# Patient Record
Sex: Female | Born: 1955 | Hispanic: Yes | Marital: Single
Health system: Southern US, Community
[De-identification: ages and names within clinical notes are randomized; demographics above are authoritative.]

## PROBLEM LIST (undated history)

## (undated) DIAGNOSIS — E7800 Pure hypercholesterolemia, unspecified: Secondary | ICD-10-CM

---

## 2019-11-26 ENCOUNTER — Encounter (HOSPITAL_COMMUNITY): Payer: Self-pay

## 2019-11-26 ENCOUNTER — Other Ambulatory Visit: Payer: Self-pay

## 2019-11-26 ENCOUNTER — Emergency Department (HOSPITAL_COMMUNITY)
Admission: EM | Admit: 2019-11-26 | Discharge: 2019-11-26 | Disposition: A | Discharge status: Home / Self Care | Payer: Medicaid Other | Attending: Emergency Medicine | Admitting: Emergency Medicine

## 2019-11-26 ENCOUNTER — Emergency Department (HOSPITAL_COMMUNITY): Payer: Medicaid Other

## 2019-11-26 DIAGNOSIS — Z79899 Other long term (current) drug therapy: Secondary | ICD-10-CM | POA: Diagnosis not present

## 2019-11-26 DIAGNOSIS — R1010 Upper abdominal pain, unspecified: Secondary | ICD-10-CM | POA: Diagnosis present

## 2019-11-26 DIAGNOSIS — K529 Noninfective gastroenteritis and colitis, unspecified: Secondary | ICD-10-CM | POA: Diagnosis not present

## 2019-11-26 HISTORY — DX: Pure hypercholesterolemia, unspecified: E78.00

## 2019-11-26 LAB — URINALYSIS, ROUTINE W REFLEX MICROSCOPIC
Bilirubin Urine: NEGATIVE
Glucose, UA: NEGATIVE mg/dL
Hgb urine dipstick: NEGATIVE
Ketones, ur: 20 mg/dL — AB
Leukocytes,Ua: NEGATIVE
Nitrite: NEGATIVE
Protein, ur: 30 mg/dL — AB
Specific Gravity, Urine: 1.019 (ref 1.005–1.030)
pH: 9 — ABNORMAL HIGH (ref 5.0–8.0)

## 2019-11-26 LAB — CBC
HCT: 41 % (ref 36.0–46.0)
Hemoglobin: 13.9 g/dL (ref 12.0–15.0)
MCH: 31.7 pg (ref 26.0–34.0)
MCHC: 33.9 g/dL (ref 30.0–36.0)
MCV: 93.6 fL (ref 80.0–100.0)
Platelets: 344 10*3/uL (ref 150–400)
RBC: 4.38 MIL/uL (ref 3.87–5.11)
RDW: 12.2 % (ref 11.5–15.5)
WBC: 12.6 10*3/uL — ABNORMAL HIGH (ref 4.0–10.5)
nRBC: 0 % (ref 0.0–0.2)

## 2019-11-26 LAB — COMPREHENSIVE METABOLIC PANEL
ALT: 24 U/L (ref 0–44)
AST: 24 U/L (ref 15–41)
Albumin: 4.1 g/dL (ref 3.5–5.0)
Alkaline Phosphatase: 55 U/L (ref 38–126)
Anion gap: 12 (ref 5–15)
BUN: 15 mg/dL (ref 8–23)
CO2: 21 mmol/L — ABNORMAL LOW (ref 22–32)
Calcium: 9.2 mg/dL (ref 8.9–10.3)
Chloride: 104 mmol/L (ref 98–111)
Creatinine, Ser: 0.67 mg/dL (ref 0.44–1.00)
GFR calc Af Amer: >60 mL/min (ref >60)
GFR calc non Af Amer: >60 mL/min (ref >60)
Glucose, Bld: 113 mg/dL — ABNORMAL HIGH (ref 70–99)
Potassium: 3.5 mmol/L (ref 3.5–5.1)
Sodium: 137 mmol/L (ref 135–145)
Total Bilirubin: 0.7 mg/dL (ref 0.3–1.2)
Total Protein: 6.7 g/dL (ref 6.5–8.1)

## 2019-11-26 LAB — LIPASE, BLOOD: Lipase: 32 U/L (ref 11–51)

## 2019-11-26 MED ORDER — METRONIDAZOLE 500 MG PO TABS
500.0000 mg | ORAL_TABLET | Freq: Two times a day (BID) | ORAL | 0 refills | Status: AC
Start: 2019-11-26 — End: ?

## 2019-11-26 MED ORDER — CIPROFLOXACIN HCL 500 MG PO TABS
500.0000 mg | ORAL_TABLET | Freq: Two times a day (BID) | ORAL | 0 refills | Status: AC
Start: 2019-11-26 — End: 2019-12-03

## 2019-11-26 MED ORDER — DICYCLOMINE HCL 20 MG PO TABS
20.0000 mg | ORAL_TABLET | Freq: Two times a day (BID) | ORAL | 0 refills | Status: AC
Start: 2019-11-26 — End: ?

## 2019-11-26 MED ORDER — SODIUM CHLORIDE 0.9% FLUSH: 3.0000 mL | Freq: Once | INTRAVENOUS | Status: DC

## 2019-11-26 MED ORDER — DICYCLOMINE HCL 10 MG PO CAPS
10.0000 mg | ORAL_CAPSULE | Freq: Once | ORAL | Status: AC
  Administered 2019-11-26: 10 mg via ORAL
  Filled 2019-11-26: qty 1

## 2019-11-26 MED ORDER — CIPROFLOXACIN HCL 500 MG PO TABS
500.0000 mg | ORAL_TABLET | Freq: Once | ORAL | Status: AC
  Administered 2019-11-26: 500 mg via ORAL
  Filled 2019-11-26: qty 1

## 2019-11-26 MED ORDER — IOHEXOL 350 MG/ML SOLN
100.0000 mL | Freq: Once | INTRAVENOUS | Status: AC | PRN
  Administered 2019-11-26: 100 mL via INTRAVENOUS

## 2019-11-26 MED ORDER — SODIUM CHLORIDE 0.9 % IV BOLUS
500.0000 mL | Freq: Once | INTRAVENOUS | Status: AC
  Administered 2019-11-26: 500 mL via INTRAVENOUS

## 2019-11-26 MED ORDER — KETOROLAC TROMETHAMINE 30 MG/ML IJ SOLN
15.0000 mg | Freq: Once | INTRAMUSCULAR | Status: AC
  Administered 2019-11-26: 15 mg via INTRAVENOUS
  Filled 2019-11-26: qty 1

## 2019-11-26 MED ORDER — METRONIDAZOLE 500 MG PO TABS
500.0000 mg | ORAL_TABLET | Freq: Once | ORAL | Status: AC
  Administered 2019-11-26: 500 mg via ORAL
  Filled 2019-11-26: qty 1

## 2019-11-26 NOTE — ED Triage Notes (Signed)
Patient complains of generalized abdominal pain since 0500, no associated symptoms. Denies constipation, denies diarrhea

## 2019-11-26 NOTE — ED Notes (Signed)
Pt d/c home per MD order. Discharge summary reviewed, wallee spanish interpretor used to assist with reviewing discharge summary. Pt verbalizes understanding. No s/s of acute distress noted. Off unit via WC. Reports discharge ride home.

## 2019-11-26 NOTE — ED Provider Notes (Signed)
About 8-hour [facility] EMERGENCY DEPARTMENT Provider Note   CSN: [id number] Arrival date & time: 11/26/19  0831     History No chief complaint on file.   Kelly Whitehead is a 64 y.o. female.  HPI    Patient was generally well, though with a noted history of fibromyalgia presents with concern of abdominal pain, nausea. Patient was in her usual state of health when she went to bed last night, but awoke this morning ago, with pain in the mid upper abdomen.  Since onset pain has been persistent, sore, raw.  There is associated nausea, no fever, no diarrhea. Patient has no history of abdominal surgery, takes no medication regularly.  Since onset it is unclear if there have been any true alleviating or exacerbating factors. Past Medical History:  Diagnosis Date  . High cholesterol     There are no problems to display for this patient.   History reviewed. No pertinent surgical history.   OB History   No obstetric history on file.     No family history on file.  Social History   Tobacco Use  . Smoking status: Not on file  Substance Use Topics  . Alcohol use: Not on file  . Drug use: Not on file    Home Medications Prior to Admission medications   Medication Sig Start Date End Date Taking? Authorizing Provider  atorvastatin (LIPITOR) 10 MG tablet Take 10 mg by mouth daily. 09/22/19  Yes [provider]  tiZANidine (ZANAFLEX) 2 MG tablet Take 1 tablet by mouth every 6 (six) hours as needed for muscle spasms.  10/22/19 10/21/20 Yes [provider]    Allergies    Patient has no known allergies.  Review of Systems   Review of Systems  Constitutional:       Per HPI, otherwise negative  HENT:       Per HPI, otherwise negative  Respiratory:       Per HPI, otherwise negative  Cardiovascular:       Per HPI, otherwise negative  Gastrointestinal: Positive for abdominal pain and nausea. Negative for vomiting.  Endocrine:   Negative aside from HPI  Genitourinary:       Neg aside from HPI   Musculoskeletal:       Per HPI, otherwise negative  Skin: Negative.   Neurological: Negative for syncope.    Physical Exam Updated Vital Signs BP (!) 144/70   Pulse 75   Temp 97.8 F (36.6 C) (Oral)   Resp 16   SpO2 97%   Physical Exam Vitals and nursing note reviewed.  Constitutional:      General: She is not in acute distress.    Appearance: She is well-developed.  HENT:     Head: Normocephalic and atraumatic.  Eyes:     Conjunctiva/sclera: Conjunctivae normal.  Cardiovascular:     Rate and Rhythm: Normal rate and regular rhythm.  Pulmonary:     Effort: Pulmonary effort is normal. No respiratory distress.     Breath sounds: Normal breath sounds. No stridor.  Abdominal:     General: There is no distension.     Tenderness: There is abdominal tenderness in the epigastric area and periumbilical area.  Skin:    General: Skin is warm and dry.  Neurological:     Mental Status: She is alert and oriented to person, place, and time.     Cranial Nerves: No cranial nerve deficit.     ED Results / Procedures / Treatments  Labs (all labs ordered are listed, but only abnormal results are displayed) Labs Reviewed  COMPREHENSIVE METABOLIC PANEL - Abnormal; Notable for the following components:      Result Value   CO2 21 (*)    Glucose, Bld 113 (*)    All other components within normal limits  CBC - Abnormal; Notable for the following components:   WBC 12.6 (*)    All other components within normal limits  URINALYSIS, ROUTINE W REFLEX MICROSCOPIC - Abnormal; Notable for the following components:   pH 9.0 (*)    Ketones, ur 20 (*)    Protein, ur 30 (*)    Bacteria, UA RARE (*)    All other components within normal limits  LIPASE, BLOOD    Radiology CT Abdomen Pelvis W Contrast  Result Date: 11/26/2019 CLINICAL DATA:  Generalized abdominal pain EXAM: CT ABDOMEN AND PELVIS WITH CONTRAST TECHNIQUE:  Multidetector CT imaging of the abdomen and pelvis was performed using the standard protocol following bolus administration of intravenous contrast. CONTRAST:  114mL OMNIPAQUE IOHEXOL 350 MG/ML SOLN COMPARISON:  None. FINDINGS: Lower chest: No acute abnormality. Hepatobiliary: Fatty infiltration of the liver is noted. The gallbladder is within normal limits. Pancreas: Unremarkable. No pancreatic ductal dilatation or surrounding inflammatory changes. Spleen: Normal in size without focal abnormality. Adrenals/Urinary Tract: Adrenal glands are within normal limits. Kidneys demonstrate a normal enhancement pattern. No renal calculi or urinary tract obstructive changes are seen. Delayed images demonstrate prominent extrarenal pelves bilaterally. Bladder is well distended. Stomach/Bowel: Mild diverticular changes noted without evidence of diverticulitis. The appendix is within normal limits. Small sliding-type hiatal hernia is noted. Multiple small bowel loops in the right abdomen demonstrate wall thickening and some fatty deposition within the wall suggestive of inflammatory bowel disease. No obstructive change is noted. Mild surrounding inflammatory change is noted. No abscess is noted. Vascular/Lymphatic: No significant vascular findings are present. No enlarged abdominal or pelvic lymph nodes. Reproductive: Uterus and bilateral adnexa are unremarkable. Other: Mild free fluid is noted within the pelvis. Musculoskeletal: No acute or significant osseous findings. IMPRESSION: Wall thickening within the mid to distal ileum without evidence of abscess or perforation. These changes are most consistent with focal enteritis or possible inflammatory bowel disease. Diverticulosis without diverticulitis. Electronically Signed   By: Inez Catalina M.D.   On: 11/26/2019 15:55    Procedures Procedures (including critical care time)  Medications Ordered in ED Medications  sodium chloride flush (NS) 0.9 % injection 3 mL (has no  administration in time range)  ciprofloxacin (CIPRO) tablet 500 mg (has no administration in time range)  metroNIDAZOLE (FLAGYL) tablet 500 mg (has no administration in time range)  dicyclomine (BENTYL) capsule 10 mg (has no administration in time range)  sodium chloride 0.9 % bolus 500 mL (500 mLs Intravenous New Bag/Given 11/26/19 1431)  ketorolac (TORADOL) 30 MG/ML injection 15 mg (15 mg Intravenous Given 11/26/19 1520)  iohexol (OMNIPAQUE) 350 MG/ML injection 100 mL (100 mLs Intravenous Contrast Given 11/26/19 1512)    ED Course  I have reviewed the triage vital signs and the nursing notes.  Pertinent labs & imaging results that were available during my care of the patient were reviewed by me and considered in my medical decision making (see chart for details).  4:04 PM Patient awake, alert, states that she feels somewhat better. We discussed findings, including generally reassuring labs aside from mild evidence for inflammation with leukocytosis, generally reassuring urinalysis. CT demonstrates enteritis, without abscess. Patient has no history of IBS, IBD,  Crohn's.  This elderly female presents with 1 day of abdominal pain, nausea. Patient is tender on exam, but has no evidence of peritonitis.  Labs inconsistent with bacteremia, sepsis, as her vital signs. Patient improved here with initial anti-inflammatory, and given her improvement, absence of hemodynamic instability, absence of infection for bacteremia, sepsis, as above, with CT suggesting enteritis, the patient started on oral antibiotics x2, anti-inflammatories, will follow up with GI as an outpatient.  Final Clinical Impression(s) / ED Diagnoses Final diagnoses:  Enteritis   Rx / DC Orders ED Discharge Orders         Ordered    ciprofloxacin (CIPRO) 500 MG tablet  2 times daily     11/26/19 1611    metroNIDAZOLE (FLAGYL) 500 MG tablet  2 times daily     11/26/19 1611    dicyclomine (BENTYL) 20 MG tablet  2 times daily      11/26/19 1611           Gerhard Munch, MD 11/26/19 1615

## 2019-11-26 NOTE — ED Notes (Signed)
Pt transported to CT ?

## 2019-11-26 NOTE — Discharge Instructions (Addendum)
Le han diagnosticado enteritis, una infeccin del intestino delgado. Tome su medicamento segn lo prescrito y haga un seguimiento con nuestros especialistas. Regrese aqu para [employer] cambios relacionados con su condicin.

## 2019-11-26 NOTE — ED Notes (Signed)
Kelly Whitehead [phone]

## 2021-01-14 IMAGING — CT CT ABD-PELV W/ CM
2 of 5 series · 16 of 46 positions shown, 18 images · IV contrast (APPLIED)
Comparison: None.

CLINICAL DATA: Generalized abdominal pain

EXAM:
CT ABDOMEN AND PELVIS WITH CONTRAST
TECHNIQUE: Multidetector CT imaging of the abdomen and pelvis was performed
using the standard protocol following bolus administration of
intravenous contrast.
CONTRAST:  100mL OMNIPAQUE IOHEXOL 350 MG/ML SOLN

[Series 3: abdomen 5.0 · axial · 0.73mm/px · z∈[-538,-148]mm · 13 of 91 slices shown, 15 images]
[im 7/91  soft-tissue]
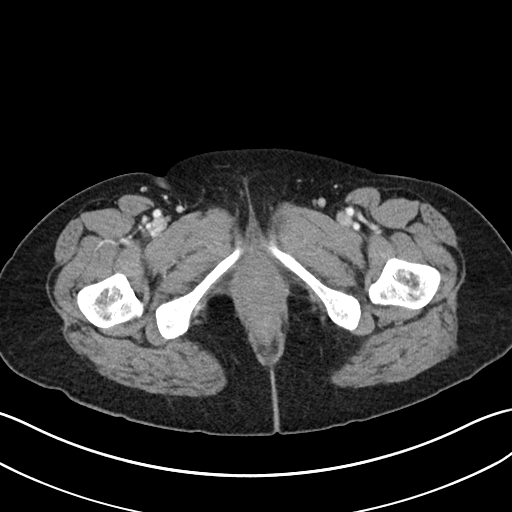
[im 7/91  bone]
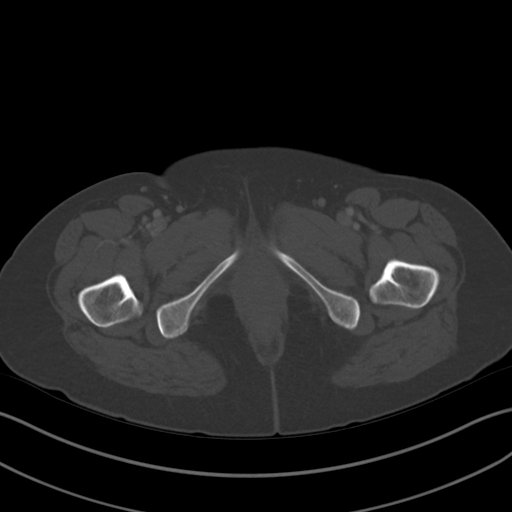
[im 13/91  soft-tissue]
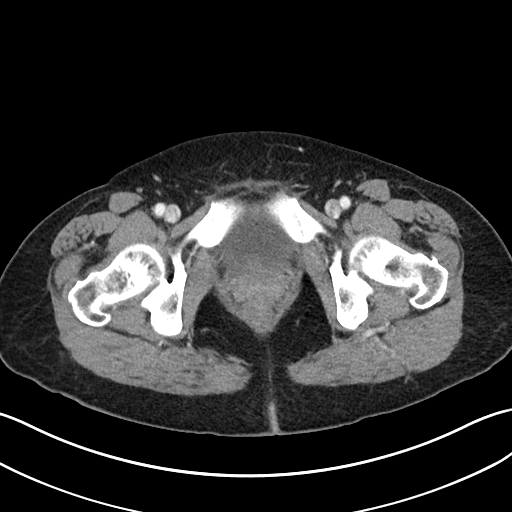
[im 19/91  soft-tissue]
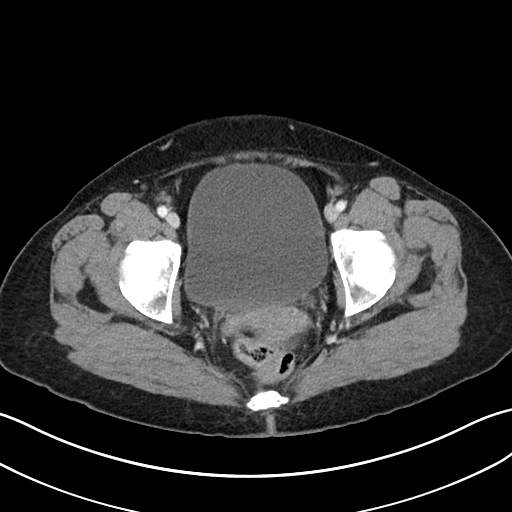
[im 25/91  soft-tissue]
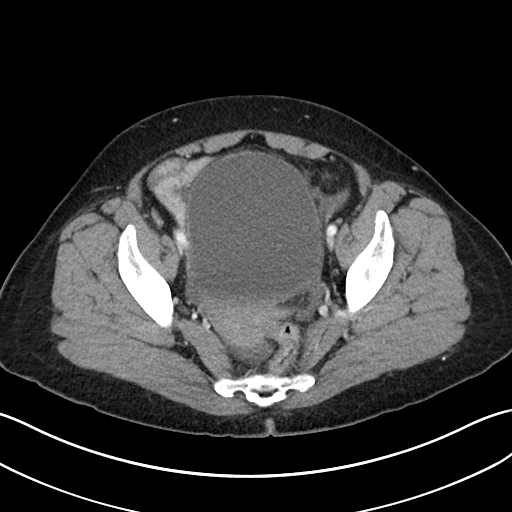
[im 31/91  soft-tissue]
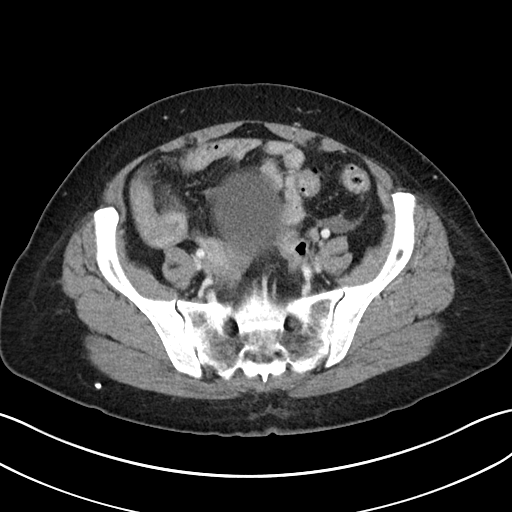
[im 37/91  soft-tissue]
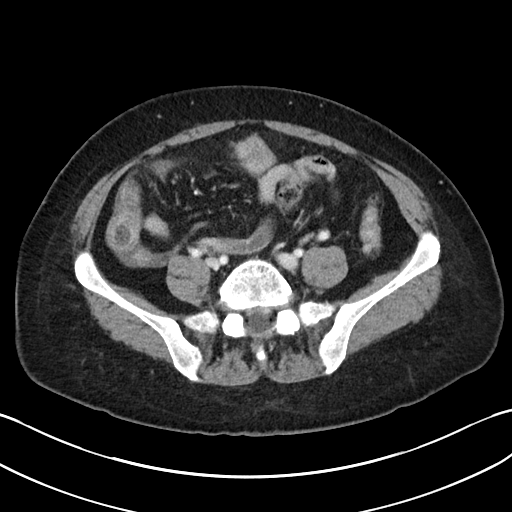
[im 49/91  soft-tissue]
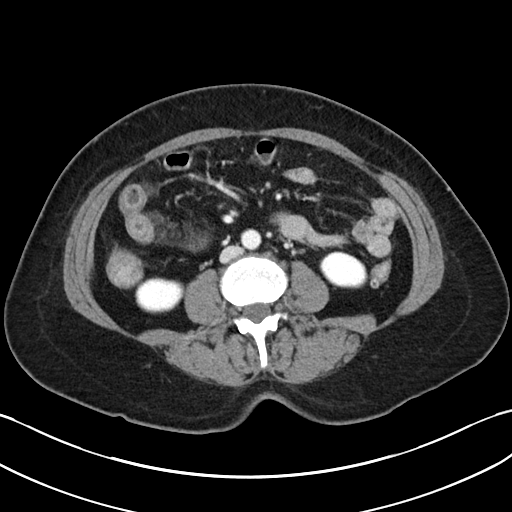
[im 55/91  soft-tissue]
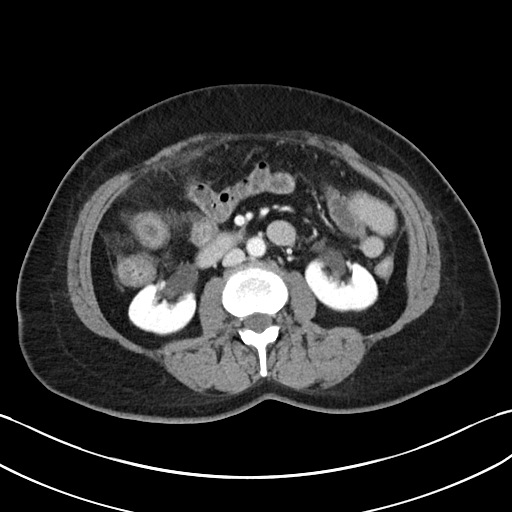
[im 61/91  soft-tissue]
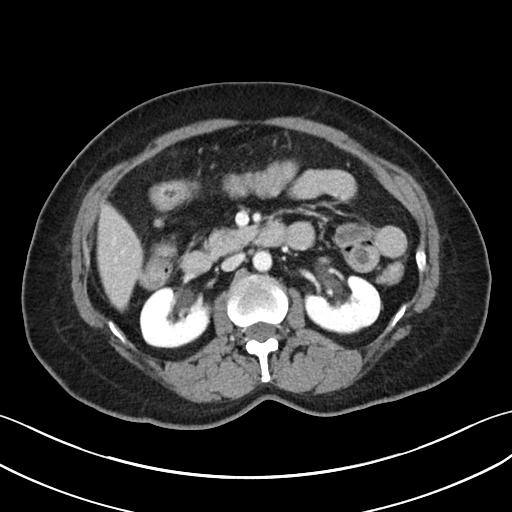
[im 61/91  bone]
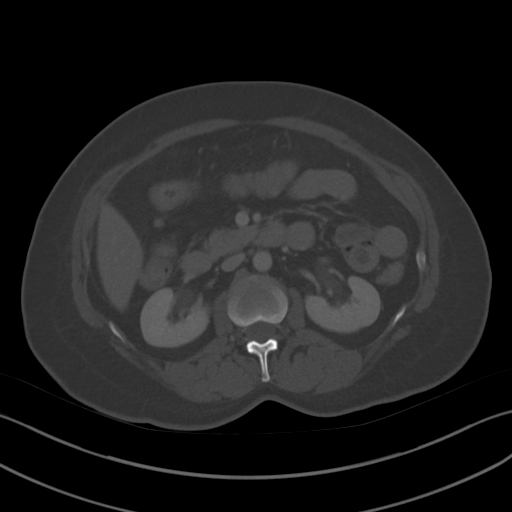
[im 67/91  soft-tissue]
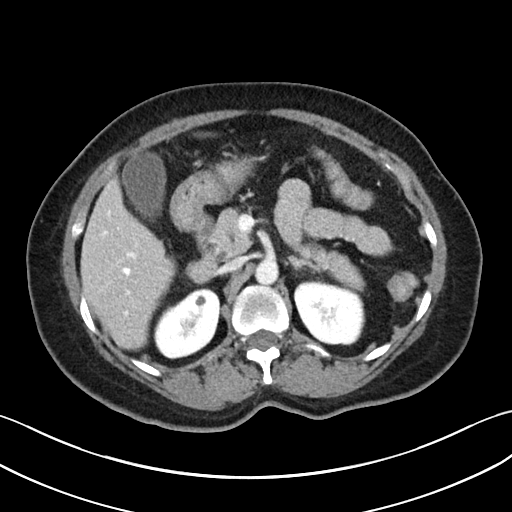
[im 73/91  soft-tissue]
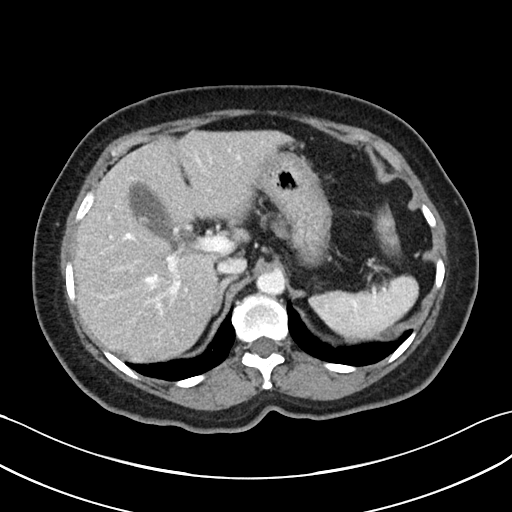
[im 79/91  soft-tissue]
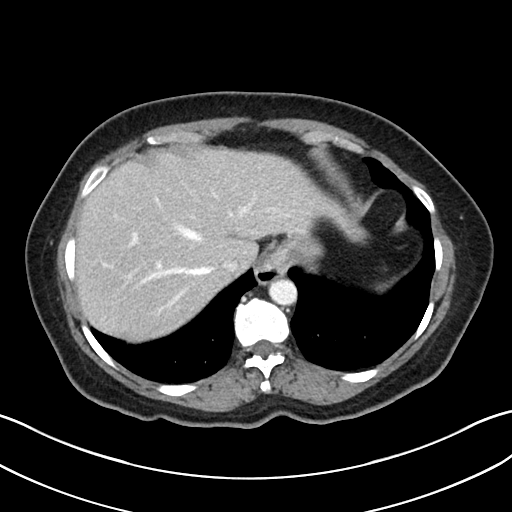
[im 85/91  soft-tissue]
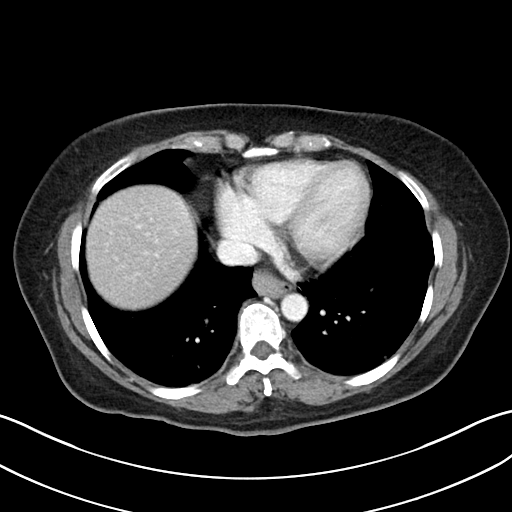

[Series 6: abdomen 3.0 mpr cor · coronal · 0.67mm/px · 3 of 89 slices shown]
[im 30/89  soft-tissue]
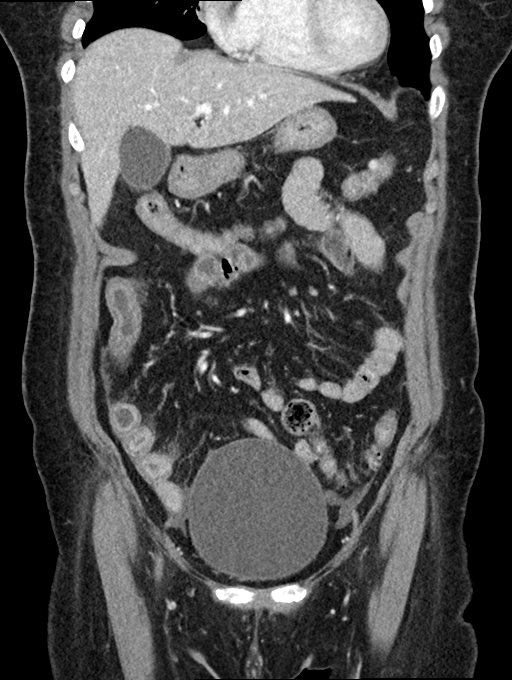
[im 40/89  soft-tissue]
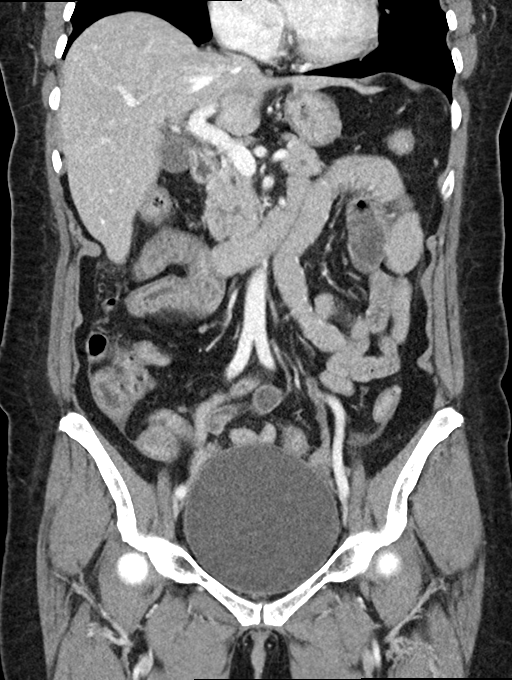
[im 49/89  soft-tissue]
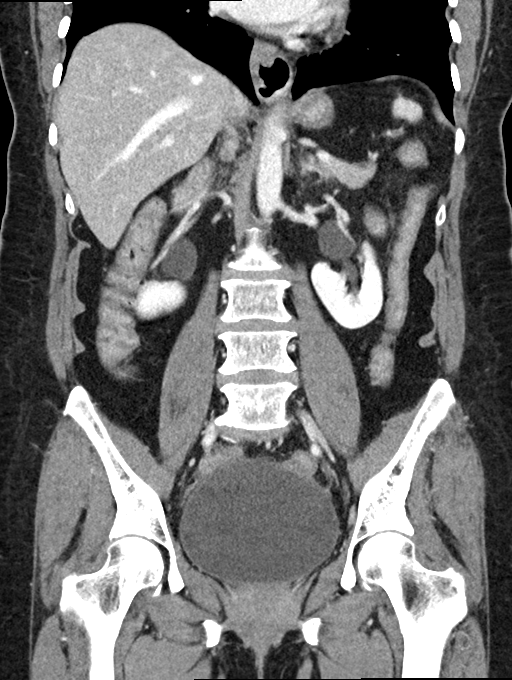

[16 of 46 positions shown; findings below may reference images not displayed]

FINDINGS: Lower chest: No acute abnormality.

Hepatobiliary: Fatty infiltration of the liver is noted. The
gallbladder is within normal limits.

Pancreas: Unremarkable. No pancreatic ductal dilatation or
surrounding inflammatory changes.

Spleen: Normal in size without focal abnormality.

Adrenals/Urinary Tract: Adrenal glands are within normal limits.
Kidneys demonstrate a normal enhancement pattern. No renal calculi
or urinary tract obstructive changes are seen. Delayed images
demonstrate prominent extrarenal pelves bilaterally. Bladder is well
distended.

Stomach/Bowel: Mild diverticular changes noted without evidence of
diverticulitis. The appendix is within normal limits. Small
sliding-type hiatal hernia is noted. Multiple small bowel loops in
the right abdomen demonstrate wall thickening and some fatty
deposition within the wall suggestive of inflammatory bowel disease.
No obstructive change is noted. Mild surrounding inflammatory change
is noted. No abscess is noted.

Vascular/Lymphatic: No significant vascular findings are present. No
enlarged abdominal or pelvic lymph nodes.

Reproductive: Uterus and bilateral adnexa are unremarkable.

Other: Mild free fluid is noted within the pelvis.

Musculoskeletal: No acute or significant osseous findings.
IMPRESSION: Wall thickening within the mid to distal ileum without evidence of
abscess or perforation. These changes are most consistent with focal
enteritis or possible inflammatory bowel disease.

Diverticulosis without diverticulitis.
